# Patient Record
Sex: Male | Born: 1980 | Race: White | Hispanic: No | Marital: Married | Smoking: Never smoker
Health system: Southern US, Community
[De-identification: ages and names within clinical notes are randomized; demographics above are authoritative.]

## PROBLEM LIST (undated history)

## (undated) DIAGNOSIS — G919 Hydrocephalus, unspecified: Secondary | ICD-10-CM

## (undated) HISTORY — PX: SHOULDER SURGERY: SHX246

## (undated) HISTORY — PX: GANGLION CYST EXCISION: SHX1691

---

## 2016-04-12 ENCOUNTER — Encounter (HOSPITAL_COMMUNITY): Payer: Self-pay | Admitting: Emergency Medicine

## 2016-04-12 ENCOUNTER — Emergency Department (HOSPITAL_COMMUNITY)
Admission: EM | Admit: 2016-04-12 | Discharge: 2016-04-13 | Disposition: A | Discharge status: Home / Self Care | Payer: Self-pay | Attending: Emergency Medicine | Admitting: Emergency Medicine

## 2016-04-12 DIAGNOSIS — S66301A Unspecified injury of extensor muscle, fascia and tendon of left index finger at wrist and hand level, initial encounter: Secondary | ICD-10-CM | POA: Insufficient documentation

## 2016-04-12 DIAGNOSIS — Y990 Civilian activity done for income or pay: Secondary | ICD-10-CM | POA: Insufficient documentation

## 2016-04-12 DIAGNOSIS — Y9389 Activity, other specified: Secondary | ICD-10-CM | POA: Insufficient documentation

## 2016-04-12 DIAGNOSIS — Y929 Unspecified place or not applicable: Secondary | ICD-10-CM | POA: Insufficient documentation

## 2016-04-12 DIAGNOSIS — S42024A Nondisplaced fracture of shaft of right clavicle, initial encounter for closed fracture: Secondary | ICD-10-CM | POA: Insufficient documentation

## 2016-04-12 DIAGNOSIS — W11XXXA Fall on and from ladder, initial encounter: Secondary | ICD-10-CM | POA: Insufficient documentation

## 2016-04-12 DIAGNOSIS — S66902A Unspecified injury of unspecified muscle, fascia and tendon at wrist and hand level, left hand, initial encounter: Secondary | ICD-10-CM

## 2016-04-12 HISTORY — DX: Hydrocephalus, unspecified: G91.9

## 2016-04-12 NOTE — ED Triage Notes (Signed)
Patient complaining of right shoulder pain and left index finger from falling off a ladder an hour ago. Patient did not hit his head. Patient did not loose consciousness.

## 2016-04-12 NOTE — ED Notes (Signed)
Bed: WA20 Expected date:  Expected time:  Means of arrival:  Comments: 

## 2016-04-13 ENCOUNTER — Emergency Department (HOSPITAL_COMMUNITY): Payer: Self-pay

## 2016-04-13 MED ORDER — OXYCODONE-ACETAMINOPHEN 5-325 MG PO TABS: 1.0000 | ORAL_TABLET | ORAL | 0 refills | Status: AC | PRN

## 2016-04-13 NOTE — ED Provider Notes (Signed)
[facility]-EMERGENCY DEPT Provider Note   CSN: [id number] Arrival date & time: 04/12/16  2315 By signing my name below, I, Levon Hedger, attest that this documentation has been prepared under the direction and in the presence of Pricilla Loveless, MD . Electronically Signed: Levon Hedger, Scribe. 04/13/2016. 12:37 AM.   History   Chief Complaint Chief Complaint  Patient presents with  . Finger Injury  . Shoulder Injury   HPI Levi Page is a 36 y.o. male who presents to the Emergency Department complaining of sudden onset, constant, moderate pain to his right lateral shoulder and left index finger s/p mechanical fall tonight PTA. Per pt, he was on a ladder tonight at work when a 30 lb box fell on his left index finger. He then let go of the ladder and fell about 10 feet, landing on his right lateral shoulder. He states he pulled his right arm close to his chest and felt a "clunk". Per pt, he is unable to extend his left index finger. He notes associated swelling and erythema to his right clavicle. He has taken 800 mg ibuprofen and 1 Tylenol with no relief of pain. He denies any numbness, weakness, head injury or LOC.   The history is provided by the patient. No language interpreter was used.   Past Medical History:  Diagnosis Date  . Hydrocephalus     There are no active problems to display for this patient.   Past Surgical History:  Procedure Laterality Date  . GANGLION CYST EXCISION     left hand  . SHOULDER SURGERY      Home Medications    Prior to Admission medications   Medication Sig Start Date End Date Taking? Authorizing Provider  oxyCODONE-acetaminophen (PERCOCET) 5-325 MG tablet Take 1 tablet by mouth every 4 (four) hours as needed for severe pain. 04/13/16   Pricilla Loveless, MD    Family History History reviewed. No pertinent family history.  Social History Social History  Substance Use Topics  . Smoking status: Never Smoker  . Smokeless tobacco: Never Used  .  Alcohol use No    Allergies   Ultram [tramadol hcl]  Review of Systems Review of Systems  Musculoskeletal: Positive for arthralgias and myalgias.  Neurological: Negative for syncope, weakness and numbness.  All other systems reviewed and are negative.  Physical Exam Updated Vital Signs BP (!) 139/93 (BP Location: Right Arm)   Pulse 98   Temp 98.7 F (37.1 C) (Oral)   Resp 20   Ht  (1.778 m)   Wt 165 lb (74.8 kg)   SpO2 97%   BMI 23.68 kg/m   Physical Exam  Constitutional: He is oriented to person, place, and time. He appears well-developed and well-nourished.  HENT:  Head: Normocephalic and atraumatic.  Right Ear: External ear normal.  Left Ear: External ear normal.  Nose: Nose normal.  Eyes: Right eye exhibits no discharge. Left eye exhibits no discharge.  Neck: Neck supple.  Cardiovascular: Normal rate, regular rhythm and normal heart sounds.   2+ radial pulses  Pulmonary/Chest: Effort normal and breath sounds normal.    Abdominal: Soft. There is no tenderness.  Musculoskeletal: He exhibits no edema.       Right shoulder: He exhibits decreased range of motion (painful) and tenderness.       Arms:      Hands: Normal strength and sensation in both upper extremities  Neurological: He is alert and oriented to person, place, and time.  Skin: Skin is warm  and dry.  Nursing note and vitals reviewed.  ED Treatments / Results  DIAGNOSTIC STUDIES:  Oxygen Saturation is 100% on RA, normal by my interpretation.    COORDINATION OF CARE:  12:35 AM Discussed treatment plan with pt at bedside and pt agreed to plan.   Labs (all labs ordered are listed, but only abnormal results are displayed) Labs Reviewed - No data to display  EKG  EKG Interpretation None      Radiology Dg Clavicle Right  Result Date: 04/13/2016 CLINICAL DATA:  Patient fell off ladder with right shoulder and clavicle pain EXAM: RIGHT CLAVICLE - 2+ VIEWS COMPARISON:  None. FINDINGS: There  is a tiny midshaft right clavicular lucency involving the upper cortex with faint rim of periosteal bone overlying it. This may represent a subacute healing mid clavicular fracture from a more remote injury. No acute displaced fracture is noted. No definite dislocation of the sternoclavicular nor AC joint. Subchondral cystic changes of the glenoid are noted likely degenerative in etiology. IMPRESSION: Tiny midshaft right clavicular lucency with faint rim of periosteal new bone overlying it. Findings may reflect a healing incomplete fracture of the clavicle from a more remote injury. No acute osseous appearing abnormality. Electronically Signed   By: Tollie Eth M.D.   On: 04/13/2016 01:24   Dg Shoulder Right  Result Date: 04/13/2016 CLINICAL DATA:  Lateral right shoulder pain after trauma EXAM: RIGHT SHOULDER - 2+ VIEW COMPARISON:  None. FINDINGS: There is no evidence of fracture or dislocation. There is no evidence of arthropathy or other focal bone abnormality. Soft tissues are unremarkable. The adjacent ribs and lung are unremarkable. IMPRESSION: No acute fracture or malalignment of the [facility] nor glenohumeral joint. The adjacent ribs and lung are unremarkable. Electronically Signed   By: Tollie Eth M.D.   On: 04/13/2016 00:40   Dg Finger Index Left  Result Date: 04/13/2016 CLINICAL DATA:  Left index finger sustained trauma from a dropped box landing on it. Minimal movement in the left finger. EXAM: LEFT INDEX FINGER 2+V COMPARISON:  None. FINDINGS: There is no evidence of fracture or dislocation. There is no evidence of arthropathy or other focal bone abnormality. Soft tissues are unremarkable. IMPRESSION: No acute osseous abnormality of the left index finger. Electronically Signed   By: Tollie Eth M.D.   On: 04/13/2016 00:37    Procedures Procedures (including critical care time)  Medications Ordered in ED Medications - No data to display   Initial Impression / Assessment and Plan / ED Course    I have reviewed the triage vital signs and the nursing notes.  Pertinent labs & imaging results that were available during my care of the patient were reviewed by me and considered in my medical decision making (see chart for details).     Patient appears to have a traumatic trigger finger on left index finger. Unable to extend. Normal flexion. No lacerations. Will place in finger splint in extension. No bony injury. f/u with Hand as outpatient. No dislocation currently of shoulder, unclear if he truly dislocated. Questionable fracture of clavicle. He denies prior fracture, thus will treat with sling, f/u with ortho. Neurologically intact otherwise. No head injury. Has allergy to tramadol but has had percocet without problems in past. Will d/c with this, continue ice, tylenol and ibuprofen.  Final Clinical Impressions(s) / ED Diagnoses   Final diagnoses:  Injury of extensor tendon of left hand, initial encounter  Closed nondisplaced fracture of shaft of right clavicle, initial encounter  New Prescriptions Discharge Medication List as of 04/13/2016  1:53 AM    START taking these medications   Details  oxyCODONE-acetaminophen (PERCOCET) 5-325 MG tablet Take 1 tablet by mouth every 4 (four) hours as needed for severe pain., Starting Fri 04/13/2016, Print       I personally performed the services described in this documentation, which was scribed in my presence. The recorded information has been reviewed and is accurate.    Pricilla Loveless, MD 04/13/16 [phone]

## 2016-04-13 NOTE — Progress Notes (Signed)
Orthopedic Tech Progress Note Patient Details:  Levi Page 11/20/1980 [id number]  Ortho Devices Type of Ortho Device: Arm sling, Finger splint Ortho Device/Splint Location: lue index finger Ortho Device/Splint Interventions: Ordered, Application   [name] 04/13/2016, 2:12 AM

## 2018-01-31 IMAGING — CR DG FINGER INDEX 2+V*L*
3 series · 3 of 3 positions shown · non-contrast
Comparison: None.

CLINICAL DATA: Left index finger sustained trauma from a dropped
box landing on it. Minimal movement in the left finger.

EXAM:
LEFT INDEX FINGER 2+V

[x finger pa left]
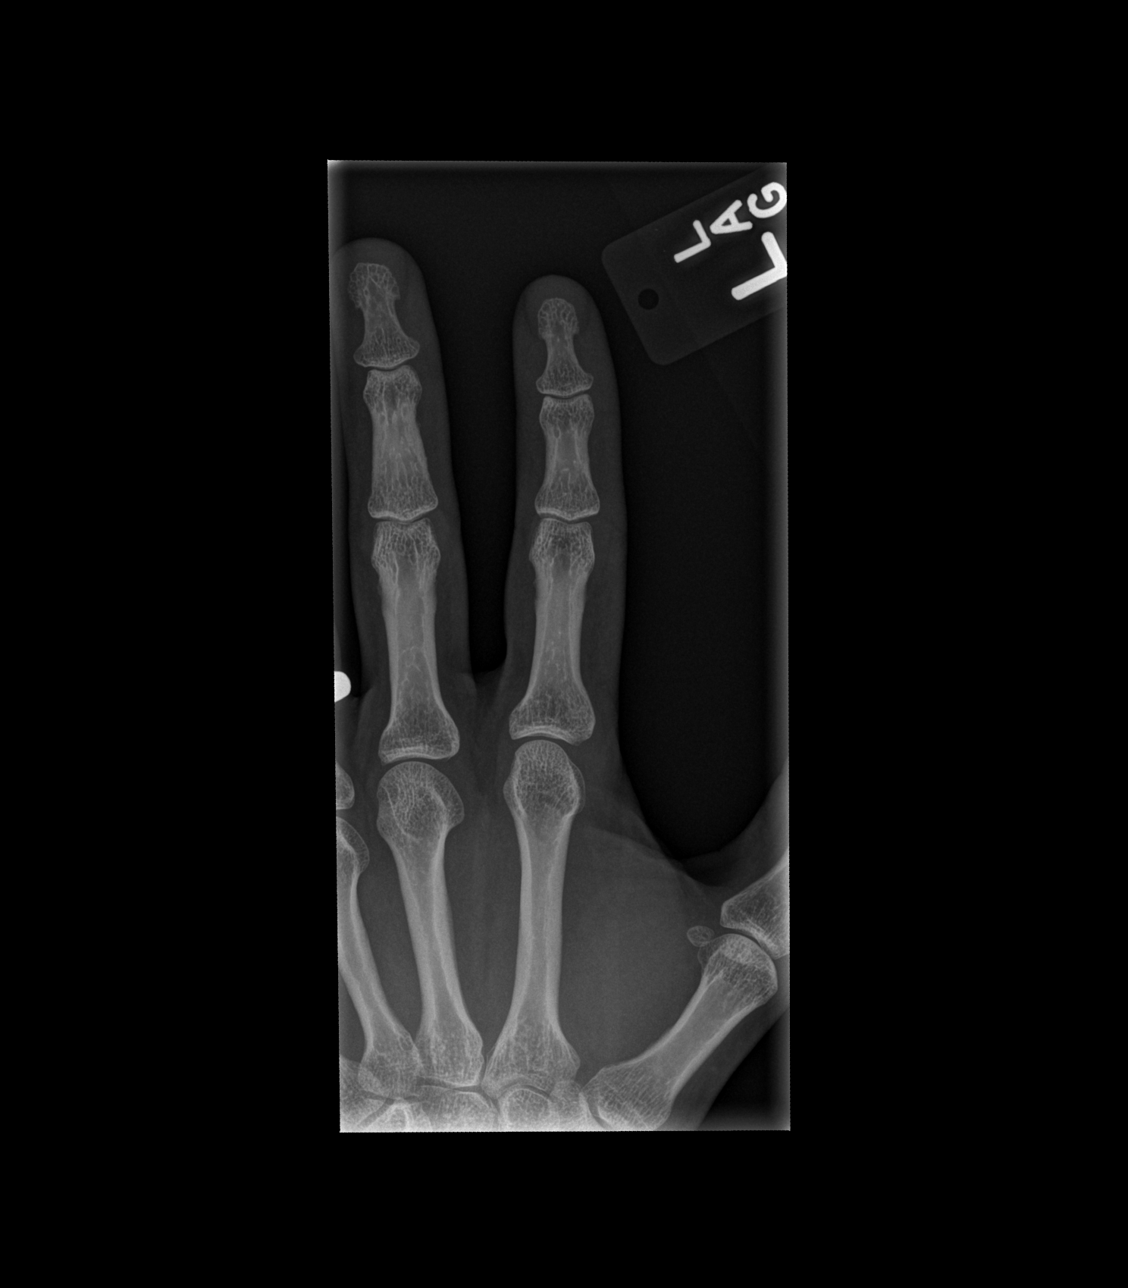

[x finger obl left]
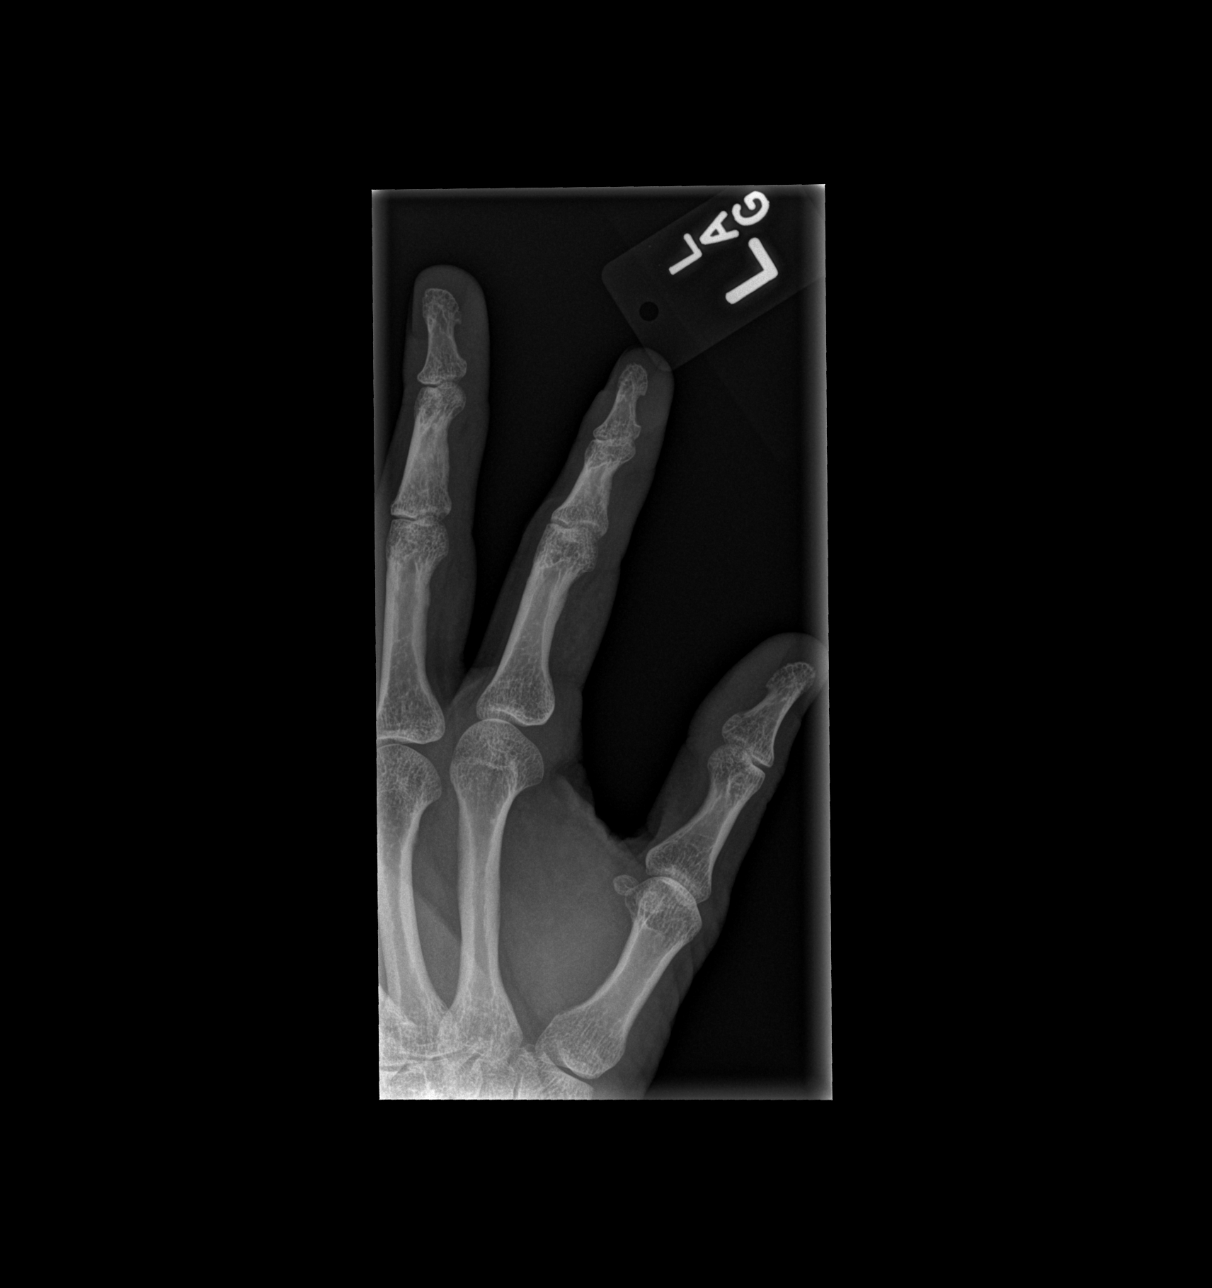

[x finger lat left]
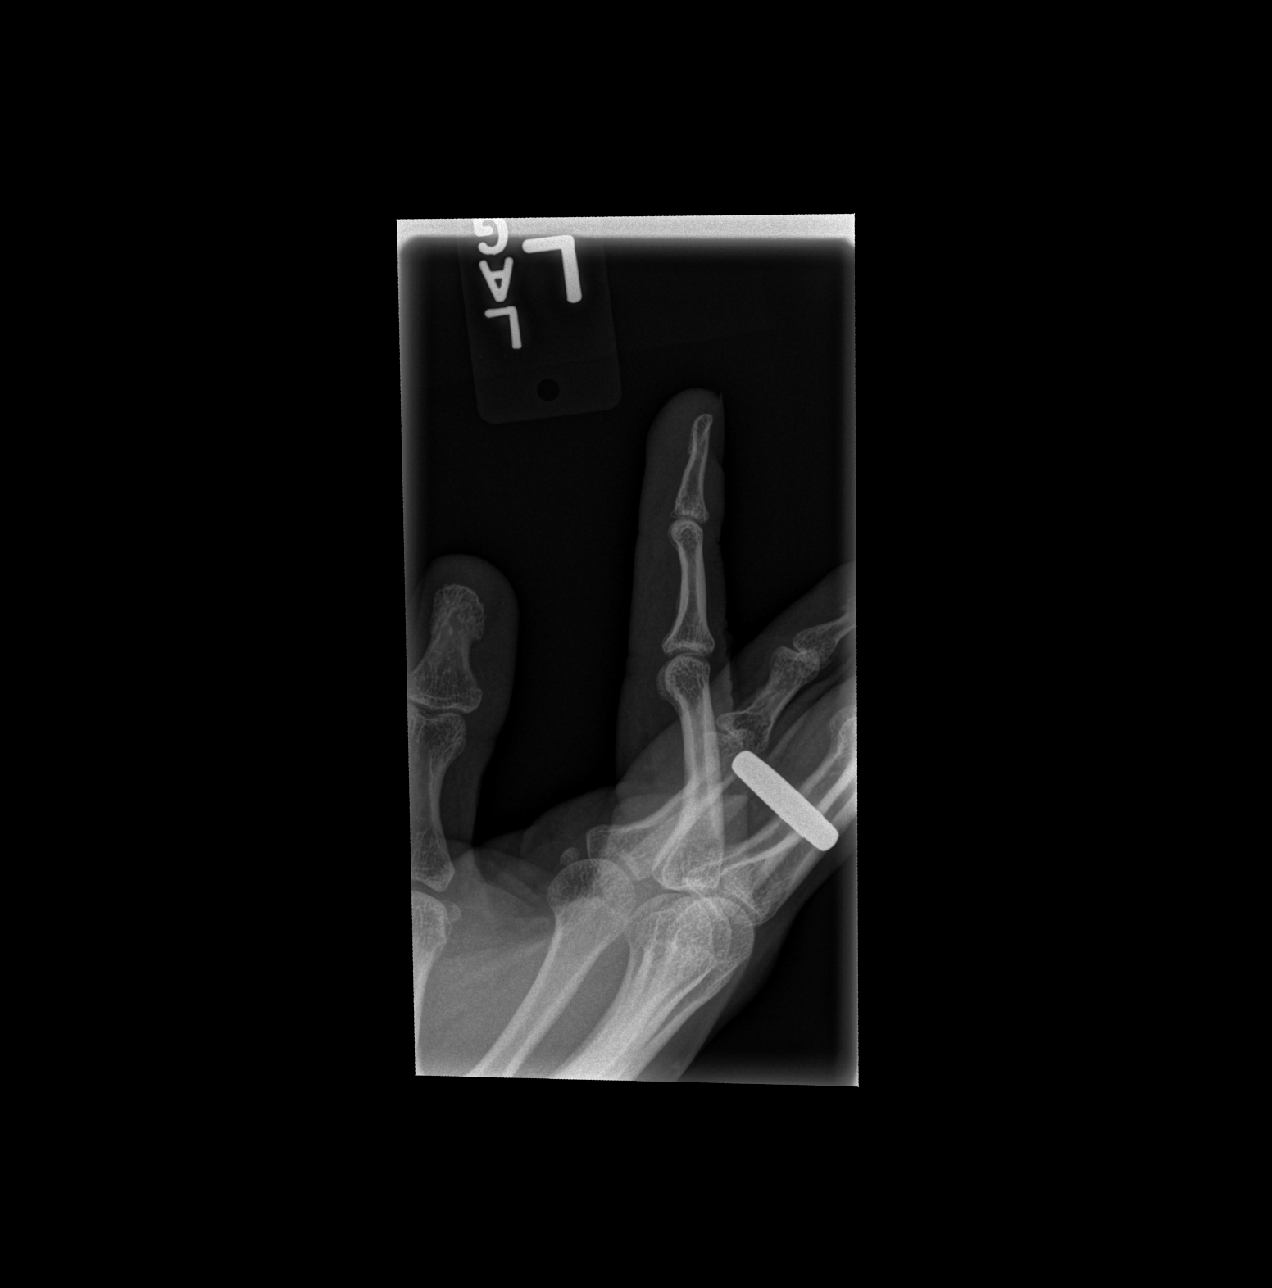

[3 of 3 positions shown; findings below may reference images not displayed]

FINDINGS: There is no evidence of fracture or dislocation. There is no
evidence of arthropathy or other focal bone abnormality. Soft
tissues are unremarkable.
IMPRESSION: No acute osseous abnormality of the left index finger.

## 2018-01-31 IMAGING — CR DG SHOULDER 2+V*R*
4 series · 4 of 4 positions shown · non-contrast
Comparison: None.

CLINICAL DATA: Lateral right shoulder pain after trauma

EXAM:
RIGHT SHOULDER - 2+ VIEW

[x shoulder axillary right]
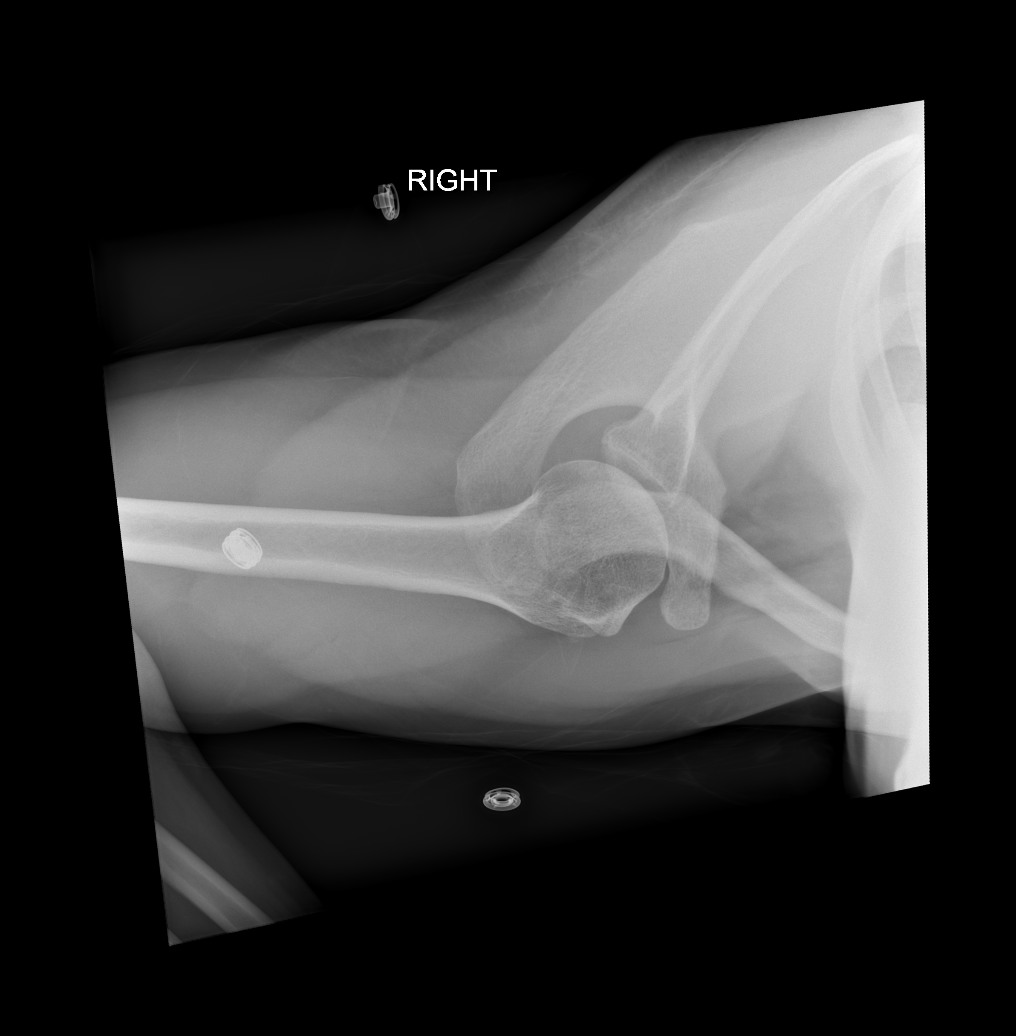

[w shoulder external right]
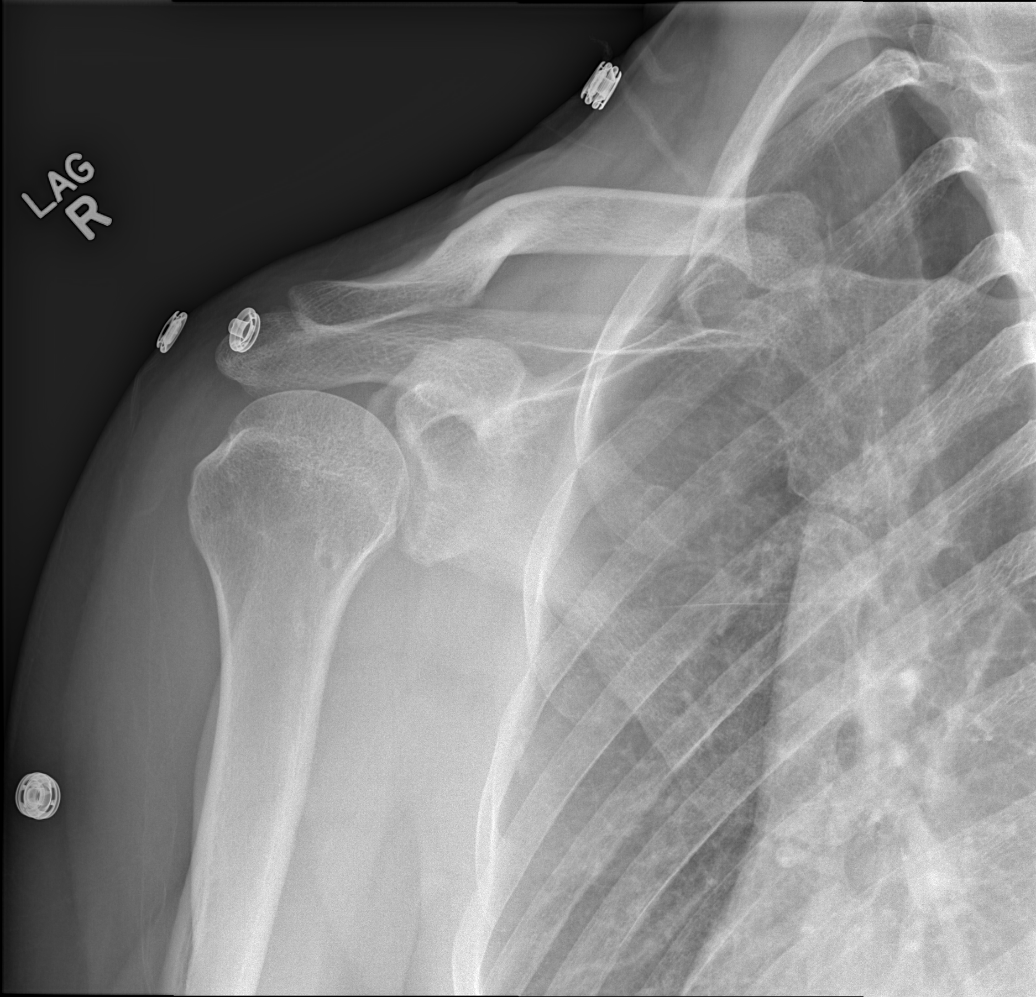

[w shoulder y-view right (1 of 2)]
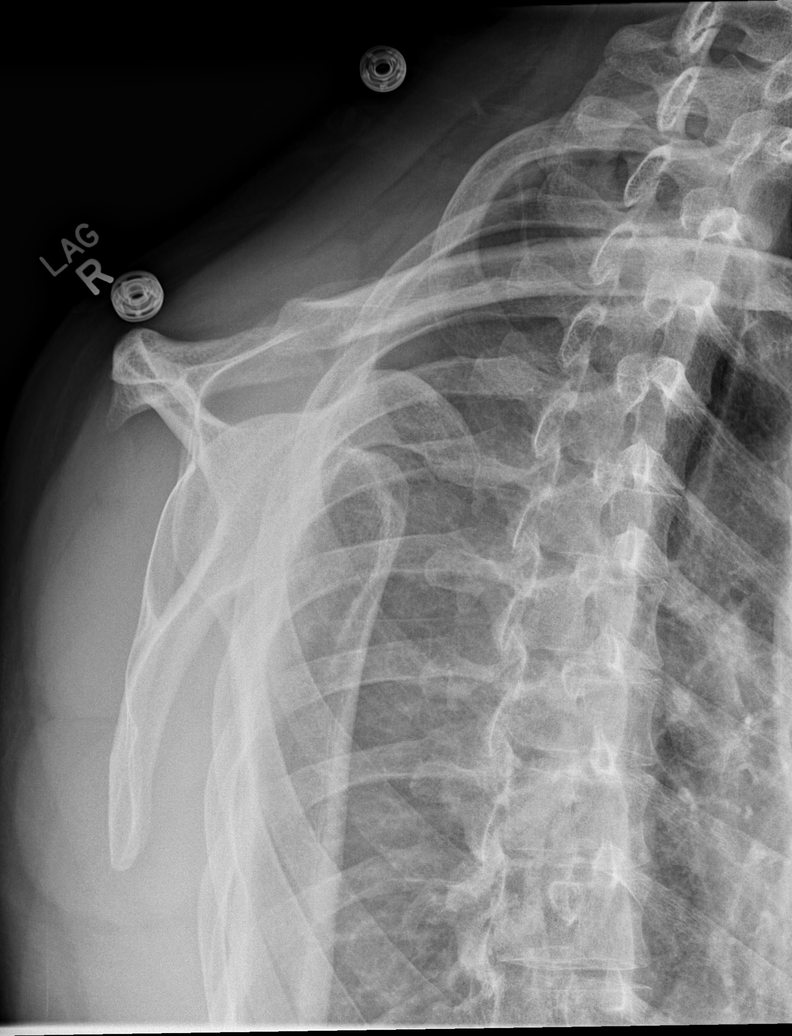

[w shoulder y-view right (2 of 2)]
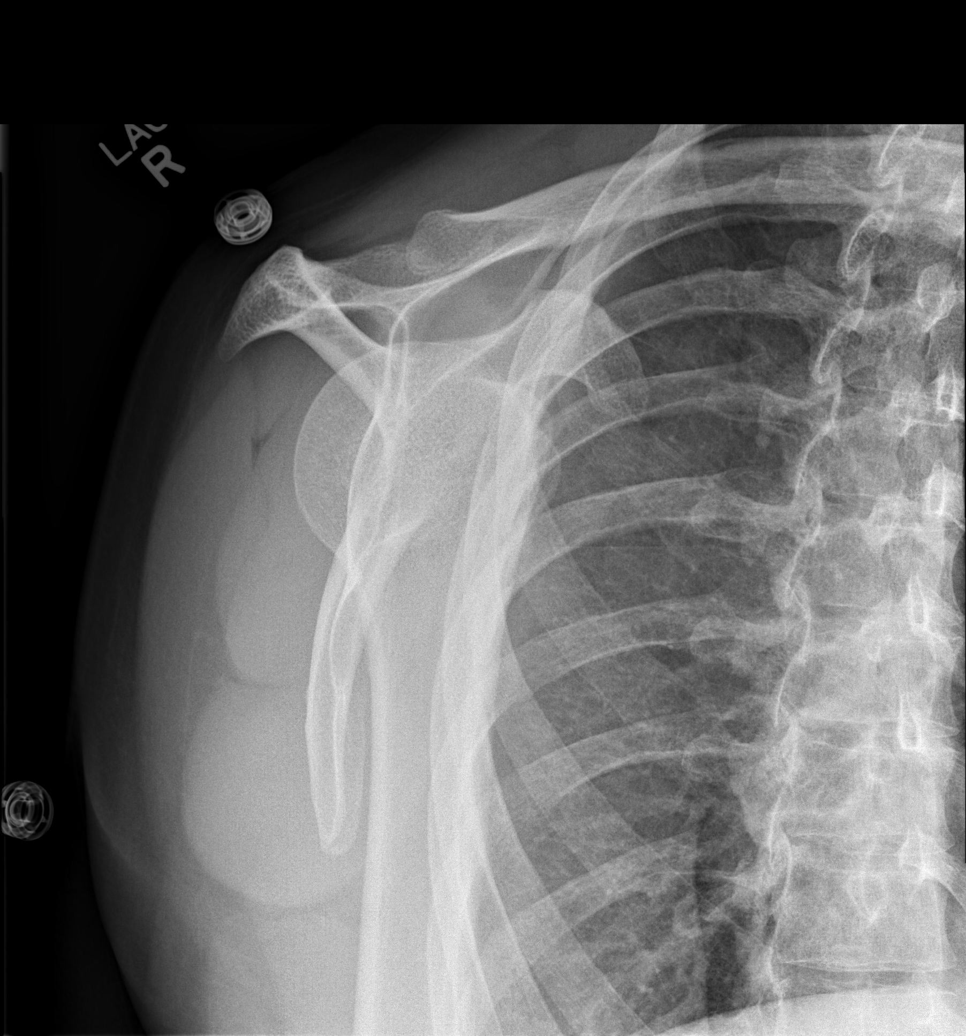

[4 of 4 positions shown; findings below may reference images not displayed]

FINDINGS: There is no evidence of fracture or dislocation. There is no
evidence of arthropathy or other focal bone abnormality. Soft
tissues are unremarkable. The adjacent ribs and lung are
unremarkable.
IMPRESSION: No acute fracture or malalignment of the AC nor glenohumeral joint.
The adjacent ribs and lung are unremarkable.
